# Patient Record
Sex: Male | Born: 1985 | Race: White | Hispanic: No | Marital: Single | Smoking: Current every day smoker
Health system: Southern US, Community
[De-identification: ages and names within clinical notes are randomized; demographics above are authoritative.]

---
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
## 2010-10-23 ENCOUNTER — Emergency Department (HOSPITAL_COMMUNITY)
Admission: EM | Admit: 2010-10-23 | Discharge: 2010-10-23 | Disposition: A | Discharge status: Home / Self Care | Payer: Self-pay | Attending: Emergency Medicine | Admitting: Emergency Medicine

## 2010-10-23 DIAGNOSIS — K047 Periapical abscess without sinus: Secondary | ICD-10-CM | POA: Insufficient documentation

## 2011-11-24 ENCOUNTER — Encounter (HOSPITAL_BASED_OUTPATIENT_CLINIC_OR_DEPARTMENT_OTHER): Payer: Self-pay | Admitting: *Deleted

## 2011-11-24 ENCOUNTER — Emergency Department (HOSPITAL_BASED_OUTPATIENT_CLINIC_OR_DEPARTMENT_OTHER)
Admission: EM | Admit: 2011-11-24 | Discharge: 2011-11-24 | Disposition: A | Discharge status: Home / Self Care | Payer: Self-pay | Attending: Emergency Medicine | Admitting: Emergency Medicine

## 2011-11-24 DIAGNOSIS — S01112A Laceration without foreign body of left eyelid and periocular area, initial encounter: Secondary | ICD-10-CM

## 2011-11-24 DIAGNOSIS — S01119A Laceration without foreign body of unspecified eyelid and periocular area, initial encounter: Secondary | ICD-10-CM | POA: Insufficient documentation

## 2011-11-24 DIAGNOSIS — W108XXA Fall (on) (from) other stairs and steps, initial encounter: Secondary | ICD-10-CM | POA: Insufficient documentation

## 2011-11-24 DIAGNOSIS — Y92009 Unspecified place in unspecified non-institutional (private) residence as the place of occurrence of the external cause: Secondary | ICD-10-CM | POA: Insufficient documentation

## 2011-11-24 MED ORDER — TETANUS-DIPHTH-ACELL PERTUSSIS 5-2.5-18.5 LF-MCG/0.5 IM SUSP
0.5000 mL | Freq: Once | INTRAMUSCULAR | Status: AC
  Administered 2011-11-24: 0.5 mL via INTRAMUSCULAR
  Filled 2011-11-24: qty 0.5

## 2011-11-24 MED ORDER — LIDOCAINE HCL 2 % IJ SOLN
INTRAMUSCULAR | Status: AC
  Filled 2011-11-24: qty 1

## 2011-11-24 NOTE — ED Provider Notes (Signed)
Medical screening examination/treatment/procedure(s) were performed by non-physician practitioner and as supervising physician I was immediately available for consultation/collaboration.   Sofhia Ulibarri A. Sarp Vernier, MD 11/24/11 2312 

## 2011-11-24 NOTE — ED Provider Notes (Signed)
History     CSN: [id number]  Arrival date & time 11/24/11  1232   None     Chief Complaint  Patient presents with  . Laceration    (Consider location/radiation/quality/duration/timing/severity/associated sxs/prior treatment) Patient is a 26 y.o. male presenting with facial injury. The history is provided by the patient. No language interpreter was used.  Facial Injury  The incident occurred yesterday. The injury mechanism was a fall. The context of the injury is unknown. There is an injury to the face. The pain is mild. It is unlikely that a foreign body is present. There were no sick contacts. He has received no recent medical care.  Pt reports he fell down steps last night.  Pt complains of laceration to eyelid.  No loc. Pt reports feels normal and has normal vision  History reviewed. No pertinent past medical history.  History reviewed. No pertinent past surgical history.  History reviewed. No pertinent family history.  History  Substance Use Topics  . Smoking status: Current Everyday Smoker  . Smokeless tobacco: Not on file  . Alcohol Use: Yes      Review of Systems  Skin: Positive for wound.  All other systems reviewed and are negative.    Allergies  Review of patient's allergies indicates no known allergies.  Home Medications  No current outpatient prescriptions on file.  BP 101/70  Pulse 94  Temp(Src) 98.2 F (36.8 C) (Oral)  Resp 20  Ht 6' (1.829 m)  Wt 140 lb (63.504 kg)  BMI 18.99 kg/m2  SpO2 98%  Physical Exam  Constitutional: He is oriented to person, place, and time. He appears well-developed and well-nourished.  HENT:  Head: Normocephalic.       Abrasion forehead,  1.5 cm laceration hour glass shaped corner of left eyelid and eyelid  Eyes: Conjunctivae and EOM are normal. Pupils are equal, round, and reactive to light.  Neck: Normal range of motion. Neck supple.  Musculoskeletal: Normal range of motion.  Neurological: He is alert and  oriented to person, place, and time. He has normal reflexes.  Skin: Skin is warm.  Psychiatric: He has a normal mood and affect.    ED Course  LACERATION REPAIR Date/Time: 11/24/2011 4:03 PM Performed by: Elson Areas Authorized by: Elson Areas Consent: Verbal consent obtained. Risks and benefits: risks, benefits and alternatives were discussed Consent given by: patient Required items: required blood products, implants, devices, and special equipment available Patient identity confirmed: verbally with patient Time out: Immediately prior to procedure a "time out" was called to verify the correct patient, procedure, equipment, support staff and site/side marked as required. Laceration length: 1.5 cm Foreign bodies: no foreign bodies Tendon involvement: none Nerve involvement: none Preparation: Patient was prepped and draped in the usual sterile fashion. Irrigation solution: saline Amount of cleaning: standard Debridement: none Degree of undermining: none Wound fascia closure material used: 7.0 vicryl. Number of sutures: 5 Technique: simple Approximation: close Approximation difficulty: simple Patient tolerance: Patient tolerated the procedure well with no immediate complications.   (including critical care time)  Labs Reviewed - No data to display No results found.   No diagnosis found.    MDM  Pt counseled on dissolvable sutures.  I advised return if any problems.        Lonia Skinner [place], [place] 11/24/11 1606

## 2011-11-24 NOTE — ED Notes (Signed)
Marcus Ortega, EDPA at bedside. 
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
## 2011-11-24 NOTE — Discharge Instructions (Signed)

## 2014-01-11 ENCOUNTER — Emergency Department (HOSPITAL_COMMUNITY): Payer: Self-pay

## 2014-01-11 ENCOUNTER — Encounter (HOSPITAL_COMMUNITY): Payer: Self-pay | Admitting: Emergency Medicine

## 2014-01-11 ENCOUNTER — Inpatient Hospital Stay (HOSPITAL_COMMUNITY)
Admission: EM | Admit: 2014-01-11 | Discharge: 2014-01-14 | DRG: 085 | Disposition: A | Discharge status: Home / Self Care | Payer: Self-pay | Attending: General Surgery | Admitting: General Surgery

## 2014-01-11 ENCOUNTER — Inpatient Hospital Stay (HOSPITAL_COMMUNITY): Payer: Self-pay

## 2014-01-11 DIAGNOSIS — S02109A Fracture of base of skull, unspecified side, initial encounter for closed fracture: Principal | ICD-10-CM | POA: Diagnosis present

## 2014-01-11 DIAGNOSIS — I609 Nontraumatic subarachnoid hemorrhage, unspecified: Secondary | ICD-10-CM | POA: Diagnosis present

## 2014-01-11 DIAGNOSIS — R51 Headache: Secondary | ICD-10-CM | POA: Diagnosis present

## 2014-01-11 DIAGNOSIS — S06309A Unspecified focal traumatic brain injury with loss of consciousness of unspecified duration, initial encounter: Principal | ICD-10-CM

## 2014-01-11 DIAGNOSIS — J9600 Acute respiratory failure, unspecified whether with hypoxia or hypercapnia: Secondary | ICD-10-CM

## 2014-01-11 DIAGNOSIS — S0630AA Unspecified focal traumatic brain injury with loss of consciousness status unknown, initial encounter: Principal | ICD-10-CM | POA: Diagnosis present

## 2014-01-11 DIAGNOSIS — S069XAA Unspecified intracranial injury with loss of consciousness status unknown, initial encounter: Secondary | ICD-10-CM | POA: Diagnosis present

## 2014-01-11 DIAGNOSIS — F17200 Nicotine dependence, unspecified, uncomplicated: Secondary | ICD-10-CM | POA: Diagnosis present

## 2014-01-11 DIAGNOSIS — S069X9A Unspecified intracranial injury with loss of consciousness of unspecified duration, initial encounter: Secondary | ICD-10-CM

## 2014-01-11 DIAGNOSIS — S065X9A Traumatic subdural hemorrhage with loss of consciousness of unspecified duration, initial encounter: Secondary | ICD-10-CM | POA: Diagnosis present

## 2014-01-11 DIAGNOSIS — IMO0002 Reserved for concepts with insufficient information to code with codable children: Secondary | ICD-10-CM | POA: Diagnosis present

## 2014-01-11 DIAGNOSIS — R4020 Unspecified coma: Secondary | ICD-10-CM | POA: Diagnosis present

## 2014-01-11 DIAGNOSIS — D62 Acute posthemorrhagic anemia: Secondary | ICD-10-CM | POA: Diagnosis present

## 2014-01-11 DIAGNOSIS — G936 Cerebral edema: Secondary | ICD-10-CM | POA: Diagnosis present

## 2014-01-11 DIAGNOSIS — F10129 Alcohol abuse with intoxication, unspecified: Secondary | ICD-10-CM | POA: Diagnosis present

## 2014-01-11 DIAGNOSIS — S065XAA Traumatic subdural hemorrhage with loss of consciousness status unknown, initial encounter: Secondary | ICD-10-CM | POA: Diagnosis present

## 2014-01-11 DIAGNOSIS — R4182 Altered mental status, unspecified: Secondary | ICD-10-CM | POA: Diagnosis present

## 2014-01-11 DIAGNOSIS — F10929 Alcohol use, unspecified with intoxication, unspecified: Secondary | ICD-10-CM

## 2014-01-11 DIAGNOSIS — F1010 Alcohol abuse, uncomplicated: Secondary | ICD-10-CM | POA: Diagnosis present

## 2014-01-11 DIAGNOSIS — R519 Headache, unspecified: Secondary | ICD-10-CM | POA: Diagnosis present

## 2014-01-11 LAB — CBC WITH DIFFERENTIAL/PLATELET
BASOS ABS: 0 10*3/uL (ref 0.0–0.1)
Basophils Relative: 0 % (ref 0–1)
EOS ABS: 0 10*3/uL (ref 0.0–0.7)
Eosinophils Relative: 0 % (ref 0–5)
HCT: 44.2 % (ref 39.0–52.0)
HEMOGLOBIN: 15.6 g/dL (ref 13.0–17.0)
Lymphocytes Relative: 9 % — ABNORMAL LOW (ref 12–46)
Lymphs Abs: 1.7 10*3/uL (ref 0.7–4.0)
MCH: 31 pg (ref 26.0–34.0)
MCHC: 35.3 g/dL (ref 30.0–36.0)
MCV: 87.9 fL (ref 78.0–100.0)
MONOS PCT: 4 % (ref 3–12)
Monocytes Absolute: 0.8 10*3/uL (ref 0.1–1.0)
Neutro Abs: 16.4 10*3/uL — ABNORMAL HIGH (ref 1.7–7.7)
Neutrophils Relative %: 87 % — ABNORMAL HIGH (ref 43–77)
Platelets: 293 10*3/uL (ref 150–400)
RBC: 5.03 MIL/uL (ref 4.22–5.81)
RDW: 13.8 % (ref 11.5–15.5)
WBC: 18.9 10*3/uL — ABNORMAL HIGH (ref 4.0–10.5)

## 2014-01-11 LAB — BASIC METABOLIC PANEL
BUN: 11 mg/dL (ref 6–23)
CALCIUM: 8.4 mg/dL (ref 8.4–10.5)
CO2: 25 mEq/L (ref 19–32)
Chloride: 102 mEq/L (ref 96–112)
Creatinine, Ser: 0.95 mg/dL (ref 0.50–1.35)
GFR calc non Af Amer: >90 mL/min (ref >90)
GLUCOSE: 111 mg/dL — AB (ref 70–99)
Potassium: 3.6 mEq/L — ABNORMAL LOW (ref 3.7–5.3)
Sodium: 143 mEq/L (ref 137–147)

## 2014-01-11 LAB — CBG MONITORING, ED: GLUCOSE-CAPILLARY: 121 mg/dL — AB (ref 70–99)

## 2014-01-11 LAB — URINALYSIS, ROUTINE W REFLEX MICROSCOPIC
Bilirubin Urine: NEGATIVE
GLUCOSE, UA: 100 mg/dL — AB
HGB URINE DIPSTICK: NEGATIVE
KETONES UR: NEGATIVE mg/dL
Leukocytes, UA: NEGATIVE
Nitrite: NEGATIVE
PROTEIN: NEGATIVE mg/dL
Specific Gravity, Urine: 1.017 (ref 1.005–1.030)
UROBILINOGEN UA: 0.2 mg/dL (ref 0.0–1.0)
pH: 5.5 (ref 5.0–8.0)

## 2014-01-11 LAB — I-STAT ARTERIAL BLOOD GAS, ED
Acid-base deficit: 5 mmol/L — ABNORMAL HIGH (ref 0.0–2.0)
Bicarbonate: 22.2 meq/L (ref 20.0–24.0)
O2 Saturation: 100 %
Patient temperature: 98.6
TCO2: 24 mmol/L (ref 0–100)
pCO2 arterial: 49 mmHg — ABNORMAL HIGH (ref 35.0–45.0)
pH, Arterial: 7.264 — ABNORMAL LOW (ref 7.350–7.450)
pO2, Arterial: 491 mmHg — ABNORMAL HIGH (ref 80.0–100.0)

## 2014-01-11 LAB — APTT: aPTT: 30 seconds (ref 24–37)

## 2014-01-11 LAB — RAPID URINE DRUG SCREEN, HOSP PERFORMED
AMPHETAMINES: NOT DETECTED
BARBITURATES: NOT DETECTED
Benzodiazepines: NOT DETECTED
Cocaine: NOT DETECTED
Opiates: NOT DETECTED
Tetrahydrocannabinol: NOT DETECTED

## 2014-01-11 LAB — PROTIME-INR
INR: 1.03 (ref 0.00–1.49)
PROTHROMBIN TIME: 13.3 s (ref 11.6–15.2)

## 2014-01-11 LAB — ETHANOL: Alcohol, Ethyl (B): 376 mg/dL — ABNORMAL HIGH (ref 0–11)

## 2014-01-11 LAB — MRSA PCR SCREENING: MRSA by PCR: NEGATIVE

## 2014-01-11 LAB — LACTIC ACID, PLASMA: Lactic Acid, Venous: 2.1 mmol/L (ref 0.5–2.2)

## 2014-01-11 LAB — TRIGLYCERIDES: Triglycerides: 217 mg/dL — ABNORMAL HIGH (ref <150)

## 2014-01-11 MED ORDER — MIDAZOLAM HCL 2 MG/2ML IJ SOLN
INTRAMUSCULAR | Status: AC
  Filled 2014-01-11: qty 6

## 2014-01-11 MED ORDER — POTASSIUM CHLORIDE IN NACL 20-0.9 MEQ/L-% IV SOLN
INTRAVENOUS | Status: DC
  Administered 2014-01-11 – 2014-01-13 (×4): via INTRAVENOUS
  Filled 2014-01-11 (×6): qty 1000

## 2014-01-11 MED ORDER — KETOROLAC TROMETHAMINE 30 MG/ML IJ SOLN: 30.0000 mg | Freq: Four times a day (QID) | INTRAMUSCULAR | Status: DC | PRN

## 2014-01-11 MED ORDER — SODIUM CHLORIDE 0.9 % IV SOLN
1000.0000 mg | Freq: Once | INTRAVENOUS | Status: AC
  Administered 2014-01-11: 1000 mg via INTRAVENOUS
  Filled 2014-01-11: qty 10

## 2014-01-11 MED ORDER — PROPOFOL 10 MG/ML IV EMUL
5.0000 ug/kg/min | Freq: Once | INTRAVENOUS | Status: AC
  Administered 2014-01-11: 70 ug/kg/min via INTRAVENOUS
  Filled 2014-01-11: qty 100

## 2014-01-11 MED ORDER — PANTOPRAZOLE SODIUM 40 MG PO TBEC
40.0000 mg | DELAYED_RELEASE_TABLET | Freq: Every day | ORAL | Status: DC
  Administered 2014-01-13 – 2014-01-14 (×2): 40 mg via ORAL
  Filled 2014-01-11 (×2): qty 1

## 2014-01-11 MED ORDER — PROPOFOL 10 MG/ML IV EMUL: 0.0000 ug/kg/min | INTRAVENOUS | Status: DC

## 2014-01-11 MED ORDER — MIDAZOLAM HCL 2 MG/2ML IJ SOLN
INTRAMUSCULAR | Status: AC
  Filled 2014-01-11: qty 2

## 2014-01-11 MED ORDER — SODIUM CHLORIDE 0.9 % IV BOLUS (SEPSIS)
1000.0000 mL | Freq: Once | INTRAVENOUS | Status: AC
  Administered 2014-01-11: 1000 mL via INTRAVENOUS

## 2014-01-11 MED ORDER — IOHEXOL 300 MG/ML  SOLN
80.0000 mL | Freq: Once | INTRAMUSCULAR | Status: AC | PRN
  Administered 2014-01-11: 80 mL via INTRAVENOUS

## 2014-01-11 MED ORDER — CHLORHEXIDINE GLUCONATE 0.12 % MT SOLN
15.0000 mL | Freq: Two times a day (BID) | OROMUCOSAL | Status: DC
  Administered 2014-01-12: 15 mL via OROMUCOSAL
  Filled 2014-01-11 (×2): qty 15

## 2014-01-11 MED ORDER — VECURONIUM BROMIDE 10 MG IV SOLR: 10.0000 mg | Freq: Once | INTRAVENOUS | Status: DC

## 2014-01-11 MED ORDER — PROPOFOL 10 MG/ML IV EMUL
INTRAVENOUS | Status: AC
  Filled 2014-01-11: qty 100

## 2014-01-11 MED ORDER — PANTOPRAZOLE SODIUM 40 MG IV SOLR
40.0000 mg | Freq: Every day | INTRAVENOUS | Status: DC
Start: 2014-01-11 — End: 2014-01-13
  Administered 2014-01-11 – 2014-01-12 (×2): 40 mg via INTRAVENOUS
  Filled 2014-01-11 (×3): qty 40

## 2014-01-11 MED ORDER — BIOTENE DRY MOUTH MT LIQD
15.0000 mL | Freq: Four times a day (QID) | OROMUCOSAL | Status: DC
  Administered 2014-01-11 – 2014-01-12 (×5): 15 mL via OROMUCOSAL

## 2014-01-11 MED ORDER — PROPOFOL 10 MG/ML IV EMUL
5.0000 ug/kg/min | Freq: Once | INTRAVENOUS | Status: DC
  Administered 2014-01-11: 50 mg via INTRAVENOUS
  Administered 2014-01-11: 10 ug/kg/min via INTRAVENOUS

## 2014-01-11 MED ORDER — ONDANSETRON HCL 4 MG/2ML IJ SOLN
4.0000 mg | Freq: Four times a day (QID) | INTRAMUSCULAR | Status: DC | PRN
  Administered 2014-01-12 – 2014-01-13 (×3): 4 mg via INTRAVENOUS
  Filled 2014-01-11 (×4): qty 2

## 2014-01-11 MED ORDER — ROCURONIUM BROMIDE 50 MG/5ML IV SOLN
INTRAVENOUS | Status: AC
  Filled 2014-01-11: qty 2

## 2014-01-11 MED ORDER — SODIUM CHLORIDE 0.9 % IV SOLN
Freq: Once | INTRAVENOUS | Status: AC
  Administered 2014-01-11: 06:00:00 via INTRAVENOUS

## 2014-01-11 MED ORDER — PROPOFOL 10 MG/ML IV EMUL
INTRAVENOUS | Status: AC
  Administered 2014-01-11: 50 mg via INTRAVENOUS
  Filled 2014-01-11: qty 100

## 2014-01-11 MED ORDER — PROPOFOL 10 MG/ML IV EMUL
5.0000 ug/kg/min | INTRAVENOUS | Status: DC
  Administered 2014-01-11 (×2): 70 ug/kg/min via INTRAVENOUS
  Administered 2014-01-11: 60 ug/kg/min via INTRAVENOUS
  Administered 2014-01-12 (×2): 70 ug/kg/min via INTRAVENOUS
  Filled 2014-01-11 (×4): qty 100

## 2014-01-11 MED ORDER — MANNITOL 25 % IV SOLN
25.0000 g | Freq: Once | INTRAVENOUS | Status: DC
  Filled 2014-01-11: qty 100

## 2014-01-11 MED ORDER — ONDANSETRON HCL 4 MG/2ML IJ SOLN
4.0000 mg | Freq: Once | INTRAMUSCULAR | Status: AC
  Administered 2014-01-11: 4 mg via INTRAVENOUS
  Filled 2014-01-11: qty 2

## 2014-01-11 MED ORDER — MIDAZOLAM HCL 2 MG/2ML IJ SOLN
4.0000 mg | Freq: Once | INTRAMUSCULAR | Status: AC
  Administered 2014-01-11: 4 mg via INTRAVENOUS

## 2014-01-11 MED ORDER — ROCURONIUM BROMIDE 50 MG/5ML IV SOLN
1.0000 mg/kg | Freq: Once | INTRAVENOUS | Status: AC
  Administered 2014-01-11: 10 mg via INTRAVENOUS

## 2014-01-11 MED ORDER — ETOMIDATE 2 MG/ML IV SOLN
INTRAVENOUS | Status: AC
  Administered 2014-01-11: 20 mg
  Filled 2014-01-11: qty 20

## 2014-01-11 MED ORDER — SUCCINYLCHOLINE CHLORIDE 20 MG/ML IJ SOLN
100.0000 mg | Freq: Once | INTRAMUSCULAR | Status: AC
  Administered 2014-01-11: 100 mg via INTRAVENOUS

## 2014-01-11 MED ORDER — LIDOCAINE HCL (CARDIAC) 20 MG/ML IV SOLN
INTRAVENOUS | Status: AC
  Administered 2014-01-11: 100 mg
  Filled 2014-01-11: qty 5

## 2014-01-11 MED ORDER — SUCCINYLCHOLINE CHLORIDE 20 MG/ML IJ SOLN
INTRAMUSCULAR | Status: AC
  Administered 2014-01-11: 100 mg
  Filled 2014-01-11: qty 1

## 2014-01-11 MED ORDER — PROPOFOL 10 MG/ML IV BOLUS
INTRAVENOUS | Status: AC
  Filled 2014-01-11: qty 20

## 2014-01-11 MED ORDER — FENTANYL CITRATE 0.05 MG/ML IJ SOLN
INTRAMUSCULAR | Status: AC
  Administered 2014-01-11: 100 ug via INTRAVENOUS
  Filled 2014-01-11: qty 2

## 2014-01-11 MED ORDER — FENTANYL CITRATE 0.05 MG/ML IJ SOLN
100.0000 ug | Freq: Once | INTRAMUSCULAR | Status: AC
  Administered 2014-01-11: 100 ug via INTRAVENOUS

## 2014-01-11 MED ORDER — ONDANSETRON HCL 4 MG PO TABS: 4.0000 mg | ORAL_TABLET | Freq: Four times a day (QID) | ORAL | Status: DC | PRN

## 2014-01-11 MED ORDER — FENTANYL CITRATE 0.05 MG/ML IJ SOLN
100.0000 ug | INTRAMUSCULAR | Status: DC | PRN
  Administered 2014-01-11 – 2014-01-12 (×4): 100 ug via INTRAVENOUS
  Filled 2014-01-11 (×5): qty 2

## 2014-01-11 MED ORDER — MIDAZOLAM HCL 2 MG/2ML IJ SOLN
6.0000 mg | INTRAMUSCULAR | Status: DC | PRN
  Administered 2014-01-11 – 2014-01-12 (×6): 6 mg via INTRAVENOUS
  Filled 2014-01-11 (×4): qty 6

## 2014-01-11 MED ORDER — THIAMINE HCL 100 MG/ML IJ SOLN
100.0000 mg | Freq: Once | INTRAMUSCULAR | Status: AC
  Administered 2014-01-11: 100 mg via INTRAVENOUS
  Filled 2014-01-11: qty 2

## 2014-01-11 MED ORDER — PROPOFOL 10 MG/ML IV EMUL
5.0000 ug/kg/min | INTRAVENOUS | Status: DC
  Administered 2014-01-11: 70 ug/kg/min via INTRAVENOUS
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
## 2014-01-11 NOTE — ED Notes (Signed)
NeuroSurgery at bedside.

## 2014-01-11 NOTE — Progress Notes (Signed)
Responded to referral from on call night chaplain to continue support to patient and family. Mother asked that  I would pray with her son. Patient going to ICU. Will pass on to unit Chaplain for on going support.  Provided prayer, spiritual and emotional support.  Will follow as needed.  01/11/14 1000  Clinical Encounter Type  Visited With Patient and family together;Health care provider  Visit Type Spiritual support;ED;Trauma  Referral From Nurse  Spiritual Encounters  Spiritual Needs Prayer;Emotional  Stress Factors  Family Stress Factors Exhausted  Mathis Daday L Londa Mackowski, Lunette StandsChaplain, pager [phone]

## 2014-01-11 NOTE — ED Notes (Signed)
Patient continues to be restless and going for tubes.  Propofol increased.  Dr Lavella LemonsManly notified.  New orders for midazolam per Dr Lavella LemonsManly.
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
## 2014-01-11 NOTE — ED Notes (Signed)
Family at bedside. 

## 2014-01-11 NOTE — Progress Notes (Addendum)
Pts ETT advanced from 24 at the lip to 25 at the lip after Xray per Dr. Lavella LemonsManly. BBS clear

## 2014-01-11 NOTE — ED Notes (Signed)
Patient transported to CT 

## 2014-01-11 NOTE — ED Notes (Signed)
Checked CBG 121
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
## 2014-01-11 NOTE — ED Notes (Signed)
Patient combative and restless.  Propofol drip increased at this time.

## 2014-01-11 NOTE — ED Notes (Signed)
Soft wrist restraints started at this time per Dr Lavella LemonsManly.

## 2014-01-11 NOTE — ED Notes (Signed)
Patient continues to be restless and trying to pull all tubes (ETT, IV, Urinary catheter).  Propofol increased.
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
## 2014-01-11 NOTE — ED Notes (Signed)
Patient returned from CT

## 2014-01-11 NOTE — H&P (Signed)
I have seen and examined the patient and agree with the assessment and plans.  Britne Borelli A. Dequavious Harshberger  MD, FACS  
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
## 2014-01-11 NOTE — ED Notes (Signed)
Patient to CT.
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
## 2014-01-12 ENCOUNTER — Inpatient Hospital Stay (HOSPITAL_COMMUNITY): Payer: Self-pay

## 2014-01-12 DIAGNOSIS — Z9911 Dependence on respirator [ventilator] status: Secondary | ICD-10-CM

## 2014-01-12 LAB — BASIC METABOLIC PANEL
BUN: 8 mg/dL (ref 6–23)
CALCIUM: 8.2 mg/dL — AB (ref 8.4–10.5)
CO2: 24 meq/L (ref 19–32)
CREATININE: 0.86 mg/dL (ref 0.50–1.35)
Chloride: 98 mEq/L (ref 96–112)
GFR calc Af Amer: >90 mL/min (ref >90)
GFR calc non Af Amer: >90 mL/min (ref >90)
GLUCOSE: 102 mg/dL — AB (ref 70–99)
Potassium: 3.3 mEq/L — ABNORMAL LOW (ref 3.7–5.3)
Sodium: 136 mEq/L — ABNORMAL LOW (ref 137–147)

## 2014-01-12 LAB — CBC
HCT: 36.1 % — ABNORMAL LOW (ref 39.0–52.0)
HEMOGLOBIN: 12.5 g/dL — AB (ref 13.0–17.0)
MCH: 30.2 pg (ref 26.0–34.0)
MCHC: 34.6 g/dL (ref 30.0–36.0)
MCV: 87.2 fL (ref 78.0–100.0)
Platelets: 231 10*3/uL (ref 150–400)
RBC: 4.14 MIL/uL — ABNORMAL LOW (ref 4.22–5.81)
RDW: 13.9 % (ref 11.5–15.5)
WBC: 14.4 10*3/uL — ABNORMAL HIGH (ref 4.0–10.5)

## 2014-01-12 MED ORDER — PNEUMOCOCCAL VAC POLYVALENT 25 MCG/0.5ML IJ INJ
0.5000 mL | INJECTION | INTRAMUSCULAR | Status: DC
  Filled 2014-01-12: qty 0.5

## 2014-01-12 MED ORDER — HYDROCODONE-ACETAMINOPHEN 10-325 MG PO TABS
0.5000 | ORAL_TABLET | ORAL | Status: DC | PRN
  Administered 2014-01-12 – 2014-01-13 (×6): 1 via ORAL
  Administered 2014-01-14: 2 via ORAL
  Administered 2014-01-14: 1 via ORAL
  Administered 2014-01-14 (×2): 2 via ORAL
  Filled 2014-01-12: qty 2
  Filled 2014-01-12: qty 1
  Filled 2014-01-12 (×3): qty 2
  Filled 2014-01-12 (×2): qty 1
  Filled 2014-01-12: qty 2
  Filled 2014-01-12 (×4): qty 1

## 2014-01-12 MED ORDER — MORPHINE SULFATE 2 MG/ML IJ SOLN
2.0000 mg | INTRAMUSCULAR | Status: DC | PRN
  Administered 2014-01-12 – 2014-01-13 (×3): 2 mg via INTRAVENOUS
  Filled 2014-01-12 (×3): qty 1

## 2014-01-12 MED ORDER — POLYETHYLENE GLYCOL 3350 17 G PO PACK
17.0000 g | PACK | Freq: Every day | ORAL | Status: DC
  Administered 2014-01-13 – 2014-01-14 (×2): 17 g via ORAL
  Filled 2014-01-12 (×3): qty 1

## 2014-01-12 MED ORDER — DOCUSATE SODIUM 100 MG PO CAPS
100.0000 mg | ORAL_CAPSULE | Freq: Two times a day (BID) | ORAL | Status: DC
  Administered 2014-01-12 – 2014-01-14 (×4): 100 mg via ORAL
  Filled 2014-01-12 (×6): qty 1

## 2014-01-12 NOTE — Progress Notes (Addendum)
c-collar d/c per order.   Marcus Ortega
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
## 2014-01-12 NOTE — Procedures (Signed)
Extubation Procedure Note  Patient Details:   Name: Marcus BurkeJoseph B Xxxdunlap DOB: 1985-12-27 MRN: [id number]   Airway Documentation:     Evaluation  O2 sats: stable throughout Complications: No apparent complications Patient did tolerate procedure well. Bilateral Breath Sounds: Clear Suctioning: Airway Yes  Patient was extubated to nasal cannula per MD order. Breath sounds are clear and no stridor present. Patient tolerated procedure well and is speaking well. No complications noted. Rt will continue to monitor.   Idell PicklesDana R Abigal Choung 01/12/2014, 8:50 AM
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
## 2014-01-13 MED ORDER — HYDROMORPHONE HCL PF 1 MG/ML IJ SOLN: 0.5000 mg | INTRAMUSCULAR | Status: DC | PRN

## 2014-01-13 NOTE — Progress Notes (Signed)
I await therapy evals to determine rehab needs. Pending. [phone]
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
## 2014-01-13 NOTE — Progress Notes (Signed)
Oupt rehab most appropriate for this pt per therapy recommendations. [phone]
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
## 2014-01-13 NOTE — Progress Notes (Signed)
UR completed.  Oria Klimas, RN BSN MHA CCM Trauma/Neuro ICU Case Manager [phone]  
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
## 2014-01-13 NOTE — Progress Notes (Signed)
Patient arrived to room transferred from ICU. He denied any pain except a little headache. Safety precautions and orders reviewed with patient. Call light within reach. Bed alarm activated. Will continue to monitor.  Sim BoastHavy , RN

## 2014-01-14 DIAGNOSIS — D62 Acute posthemorrhagic anemia: Secondary | ICD-10-CM | POA: Diagnosis present

## 2014-01-14 DIAGNOSIS — R519 Headache, unspecified: Secondary | ICD-10-CM | POA: Diagnosis present

## 2014-01-14 DIAGNOSIS — J9600 Acute respiratory failure, unspecified whether with hypoxia or hypercapnia: Secondary | ICD-10-CM | POA: Diagnosis present

## 2014-01-14 DIAGNOSIS — F10129 Alcohol abuse with intoxication, unspecified: Secondary | ICD-10-CM | POA: Diagnosis present

## 2014-01-14 DIAGNOSIS — R51 Headache: Secondary | ICD-10-CM | POA: Diagnosis present

## 2014-01-14 MED ORDER — POLYETHYLENE GLYCOL 3350 17 G PO PACK: 17.0000 g | PACK | Freq: Every day | ORAL | Status: DC

## 2014-01-14 MED ORDER — HYDROCODONE-ACETAMINOPHEN 10-325 MG PO TABS: 0.5000 | ORAL_TABLET | Freq: Four times a day (QID) | ORAL | Status: DC | PRN

## 2014-01-14 MED ORDER — DSS 100 MG PO CAPS: 100.0000 mg | ORAL_CAPSULE | Freq: Two times a day (BID) | ORAL | Status: DC

## 2014-01-14 NOTE — Discharge Instructions (Signed)
Alcohol Intoxication Alcohol intoxication occurs when you drink enough alcohol that it affects your ability to function. It can be mild or very severe. Drinking a lot of alcohol in a short time is called binge drinking. This can be very harmful. Drinking alcohol can also be more dangerous if you are taking medicines or other drugs. Some of the effects caused by alcohol may include:  Loss of coordination.  Changes in mood and behavior.  Unclear thinking.  Trouble talking (slurred speech).  Throwing up (vomiting).  Confusion.  Slowed breathing.  Twitching and shaking (seizures).  Loss of consciousness. HOME CARE  Do not drive after drinking alcohol.  Drink enough water and fluids to keep your pee (urine) clear or pale yellow. Avoid caffeine.  Only take medicine as told by your doctor. GET HELP IF:  You throw up (vomit) many times.  You do not feel better after a few days.  You frequently have alcohol intoxication. Your doctor can help decide if you should see a substance use treatment counselor. GET HELP RIGHT AWAY IF:  You become shaky when you stop drinking.  You have twitching and shaking.  You throw up blood. It may look bright red or like coffee grounds.  You notice blood in your poop (bowel movements).  You become lightheaded or pass out (faint). MAKE SURE YOU:   Understand these instructions.  Will watch your condition.  Will get help right away if you are not doing well or get worse. Document Released: 02/13/2008 Document Revised: 04/29/2013 Document Reviewed: 01/30/2013 [facility] Patient Information 2014 [employer], [place].  Traumatic Brain Injury Traumatic brain injury (TBI) occurs when an injury to the head causes the brain to move back and forth. The risk of brain injury varies with the severity of the trauma. The damage can be confined to one area of the brain (focal) or involve different areas of the brain (diffuse). The severity of a brain injury can  range from:  A blow or jolt to the head that disrupts the normal function of the brain (concussion).  A deep state of unconsciousness (coma).  Death. CAUSES  A brain injury can result from:  A closed head injury. This occurs when the head suddenly and violently hits an object but the object does not break through the skull. Examples include:  A direct blow (hitting your head on a hard surface).  An indirect blow (when your head moves rapidly and violently back and forth, like in a car crash). This injury is called contrecoup (involving a blow and counter blow). Shaken baby syndrome is a severe type of this injury. It happens when a baby is shaken forcibly enough to cause extreme contrecoup injury.  Penetrating head injury. A penetrating head injury occurs when an object pierces the skull and enters the brain tissue. Examples include:  A skull fracture occurs when the skull cracks or breaks.  A depressed skull fracture occurs when pieces of the broken skull press into the tissue of the brain. This can cause bruising of the brain tissue called a contusion. In both closed and penetrating head injuries, damage to blood vessels can cause heavy bleeding into or around the brain.  SYMPTOMS  The symptoms of a TBI depend on the type and severity of the injury. The most common symptoms include:   Confusion (disorientation) or other thinking problems.  An inability to remember events around the time of the injury (amnesia).  Difficulty staying awake or passing out (loss of consciousness).  Difficulty maintaining  your balance or feeling unsteady.  Slow reaction time.  Difficulty learning or remembering things you have heard.  Headache.  Blurry vision.  Vomiting.  Seizures.  Swelling of the scalp. This occurs because of bleeding or swelling under the skin of the skull when the head is hit. TREATMENT Treatment of traumatic brain injury can involve a range of different medical  options:  If a brain injury is moderate to severe, a hospital stay will be necessary to monitor:  Neurological status.  Pressure or swelling of the brain (intracranial pressure).  For seizures.  Severe brain injury cases may need surgery to:  Control bleeding.  Relieve pressure on the brain.  Remove objects from the brain that result from a penetrating injury.  Repair the skull from an injury.  Long-term treatment of a brain injury can involve rehabilitation work such as:  Physical therapy.  Occupational therapy.  Speech therapy. PROGNOSIS The outcome of TBI depends on the cause of the injury, location, severity, and extent of neurological damage. Outcomes range from good recovery to death. Long term consequences of a TBI can include:  Difficulty concentrating or having a short attention span.  Change in personality.  Irritability.  Headaches.  Blurry vision.  Sleepiness.  Depression.  Unsteadiness that makes walking or standing hard to do. For more information and support, contact: [employer] [facility] [employer].  Document Released: 08/17/2002 Document Revised: 06/17/2013 Document Reviewed: 08/25/2009 [facility] Patient Information 2014 [place]Care, [place]LL[place].

## 2014-01-14 NOTE — Progress Notes (Signed)
Patient examined and I agree with the assessment and plan  Violeta GelinasBurke Jerriah Ines, MD, MPH, FACS Trauma: [phone] General Surgery: [phone]  01/14/2014 1:16 PM
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
## 2014-01-14 NOTE — Progress Notes (Signed)
Pt A&O x4; pt discharge education and instructions completed with pt and mother at bedside. Both voices understanding and denies any questions. All lines including IV removed from pt. Pt given handout information about alcohol and concussion. Pt transported off unit via wheelchair whiles mother left ahead with pt belongings to car. Pt given his prescription for Hydrocodone 10-325mg . P. Amo Kionte Baumgardner RN.

## 2014-01-14 NOTE — Discharge Summary (Signed)
Marcus Townley, MD, MPH, FACS Trauma: [phone] General Surgery: [phone]  

## 2014-01-14 NOTE — Progress Notes (Signed)
Pt with need for HHPT at d/c. Confirmed address and phone number for him and for his mother also listed in EPIC. Chose [facility] based on coverage restrictions. Denies need for any DME at d/c.

## 2014-07-17 ENCOUNTER — Emergency Department (HOSPITAL_BASED_OUTPATIENT_CLINIC_OR_DEPARTMENT_OTHER)
Admission: EM | Admit: 2014-07-17 | Discharge: 2014-07-17 | Disposition: A | Discharge status: Home / Self Care | Payer: Self-pay | Attending: Emergency Medicine | Admitting: Emergency Medicine

## 2014-07-17 ENCOUNTER — Encounter (HOSPITAL_BASED_OUTPATIENT_CLINIC_OR_DEPARTMENT_OTHER): Payer: Self-pay | Admitting: *Deleted

## 2014-07-17 DIAGNOSIS — Z720 Tobacco use: Secondary | ICD-10-CM | POA: Insufficient documentation

## 2014-07-17 DIAGNOSIS — Z79899 Other long term (current) drug therapy: Secondary | ICD-10-CM | POA: Insufficient documentation

## 2014-07-17 DIAGNOSIS — K047 Periapical abscess without sinus: Secondary | ICD-10-CM | POA: Insufficient documentation

## 2014-07-17 DIAGNOSIS — R590 Localized enlarged lymph nodes: Secondary | ICD-10-CM | POA: Insufficient documentation

## 2014-07-17 MED ORDER — HYDROCODONE-ACETAMINOPHEN 5-325 MG PO TABS: 1.0000 | ORAL_TABLET | ORAL | Status: DC | PRN

## 2014-07-17 MED ORDER — CLINDAMYCIN HCL 150 MG PO CAPS: 150.0000 mg | ORAL_CAPSULE | Freq: Four times a day (QID) | ORAL | Status: DC

## 2014-07-17 NOTE — ED Provider Notes (Signed)
CSN: [id number]     Arrival date & time 07/17/14  1015 History   First MD Initiated Contact with Patient 07/17/14 1209     Chief Complaint  Patient presents with  . Facial Pain     (Consider location/radiation/quality/duration/timing/severity/associated sxs/prior Treatment) Patient is a 28 y.o. male presenting with tooth pain. The history is provided by the patient.  Dental Pain Location:  Lower Lower teeth location:  32/RL 3rd molar Quality:  Throbbing Severity:  Severe Onset quality:  Gradual Duration:  1 day Timing:  Constant Progression:  Worsening Chronicity:  New Context: abscess   Relieved by:  Nothing Worsened by:  Nothing tried Ineffective treatments:  NSAIDs Associated symptoms: gum swelling   Risk factors: smoking    Marcus Ortega is a 28 y.o. male who presents to the ED with right jaw pain that started last night and has increased today. The pain is severe.   Although not listed as an allergy the patient states he can not take penicillin or Amoxicillin   History reviewed. No pertinent past medical history. History reviewed. No pertinent past surgical history. No family history on file. History  Substance Use Topics  . Smoking status: Current Every Day Smoker  . Smokeless tobacco: Not on file  . Alcohol Use: Yes    Review of Systems Negative except as stated in HPI   Allergies  Review of patient's allergies indicates no known allergies.  Home Medications   Prior to Admission medications   Medication Sig Start Date End Date Taking? Authorizing Provider  docusate sodium 100 MG CAPS Take 100 mg by mouth 2 (two) times daily. 01/14/14   Megan N Dort, PA-C  HYDROcodone-acetaminophen (NORCO) 10-325 MG per tablet Take 0.5-2 tablets by mouth every 6 (six) hours as needed (1/2 tablet for mild pain, 1 tablet for moderate pain, 2 tablets for severe pain). 01/14/14   Megan N Dort, PA-C  polyethylene glycol (MIRALAX / GLYCOLAX) packet Take 17 g by mouth daily. 01/14/14    Megan N Dort, PA-C   BP 138/89 mmHg  Pulse 57  Temp(Src) 98.2 F (36.8 C) (Oral)  Resp 20  Ht 6' (1.829 m)  Wt 145 lb (65.772 kg)  BMI 19.66 kg/m2  SpO2 100% Physical Exam  Constitutional: He is oriented to person, place, and time. He appears well-developed and well-nourished.  HENT:  Mouth/Throat: Uvula is midline, oropharynx is clear and moist and mucous membranes are normal.    Right lower third molar partially erupted with decay and swelling of the gum surrounding the tooth. Facial swelling noted in the same dental area.   Eyes: EOM are normal.  Neck: Normal range of motion. Neck supple.  Cardiovascular: Normal rate and regular rhythm.   Pulmonary/Chest: Effort normal.  Musculoskeletal: Normal range of motion.  Lymphadenopathy:    He has cervical adenopathy.  Neurological: He is alert and oriented to person, place, and time. No cranial nerve deficit.  Skin: Skin is warm and dry.  Psychiatric: He has a normal mood and affect. His behavior is normal.  Nursing note and vitals reviewed.   ED Course  Procedures (  MDM  28 y.o. male with dental pain and facial swelling that started with the past 24 hours. Stable for discharge without fever, difficulty swallowing or facial cellulitis. Discussed with the patient clinical findings and plan of care and all questioned fully answered. He will follow up with a dentist as soon as possible or return here as needed for any problems.  Medication List    STOP taking these medications        HYDROcodone-acetaminophen 10-325 MG per tablet  Commonly known as:  NORCO  Replaced by:  HYDROcodone-acetaminophen 5-325 MG per tablet      TAKE these medications        clindamycin 150 MG capsule  Commonly known as:  CLEOCIN  Take 1 capsule (150 mg total) by mouth every 6 (six) hours.     HYDROcodone-acetaminophen 5-325 MG per tablet  Commonly known as:  NORCO/VICODIN  Take 1 tablet by mouth every 4 (four) hours as needed.      ASK  your doctor about these medications        DSS 100 MG Caps  Take 100 mg by mouth 2 (two) times daily.     polyethylene glycol packet  Commonly known as:  MIRALAX / GLYCOLAX  Take 17 g by mouth daily.         EllisHope M Neese, NP 07/17/14 1225  Toy CookeyMegan Docherty, MD 07/17/14 2119

## 2014-07-17 NOTE — ED Notes (Signed)
States that he is having right facial pain and swelling that started last night as a toothache.

## 2014-07-17 NOTE — Discharge Instructions (Signed)
Continue to take ibuprofen in addition to the other medications and see a dentist as soon as possible.  Dental Abscess A dental abscess is a collection of infected fluid (pus) from a bacterial infection in the inner part of the tooth (pulp). It usually occurs at the end of the tooth's root.  CAUSES   Severe tooth decay.  Trauma to the tooth that allows bacteria to enter into the pulp, such as a broken or chipped tooth. SYMPTOMS   Severe pain in and around the infected tooth.  Swelling and redness around the abscessed tooth or in the mouth or face.  Tenderness.  Pus drainage.  Bad breath.  Bitter taste in the mouth.  Difficulty swallowing.  Difficulty opening the mouth.  Nausea.  Vomiting.  Chills.  Swollen neck glands. DIAGNOSIS   A medical and dental history will be taken.  An examination will be performed by tapping on the abscessed tooth.  X-rays may be taken of the tooth to identify the abscess. TREATMENT The goal of treatment is to eliminate the infection. You may be prescribed antibiotic medicine to stop the infection from spreading. A root canal may be performed to save the tooth. If the tooth cannot be saved, it may be pulled (extracted) and the abscess may be drained.  HOME CARE INSTRUCTIONS  Only take over-the-counter or prescription medicines for pain, fever, or discomfort as directed by your caregiver.  Rinse your mouth (gargle) often with salt water ( tsp salt in 8 oz [250 ml] of warm water) to relieve pain or swelling.  Do not drive after taking pain medicine (narcotics).  Do not apply heat to the outside of your face.  Return to your dentist for further treatment as directed. SEEK MEDICAL CARE IF:  Your pain is not helped by medicine.  Your pain is getting worse instead of better. SEEK IMMEDIATE MEDICAL CARE IF:  You have a fever or persistent symptoms for more than 2-3 days.  You have a fever and your symptoms suddenly get worse.  You  have chills or a very bad headache.  You have problems breathing or swallowing.  You have trouble opening your mouth.  You have swelling in the neck or around the eye. Document Released: 08/27/2005 Document Revised: 05/21/2012 Document Reviewed: 12/05/2010 [facility] Patient Information 2015 [place] [place]. This information is not intended to replace advice given to you by your health care provider. Make sure you discuss any questions you have with your health care provider.

## 2015-07-27 ENCOUNTER — Emergency Department (HOSPITAL_BASED_OUTPATIENT_CLINIC_OR_DEPARTMENT_OTHER)
Admission: EM | Admit: 2015-07-27 | Discharge: 2015-07-27 | Disposition: A | Discharge status: Home / Self Care | Payer: BLUE CROSS/BLUE SHIELD | Attending: Emergency Medicine | Admitting: Emergency Medicine

## 2015-07-27 ENCOUNTER — Encounter (HOSPITAL_BASED_OUTPATIENT_CLINIC_OR_DEPARTMENT_OTHER): Payer: Self-pay | Admitting: Emergency Medicine

## 2015-07-27 DIAGNOSIS — M546 Pain in thoracic spine: Secondary | ICD-10-CM | POA: Diagnosis not present

## 2015-07-27 DIAGNOSIS — M549 Dorsalgia, unspecified: Secondary | ICD-10-CM | POA: Diagnosis present

## 2015-07-27 DIAGNOSIS — F17200 Nicotine dependence, unspecified, uncomplicated: Secondary | ICD-10-CM | POA: Insufficient documentation

## 2015-07-27 DIAGNOSIS — M545 Low back pain, unspecified: Secondary | ICD-10-CM

## 2015-07-27 MED ORDER — KETOROLAC TROMETHAMINE 60 MG/2ML IM SOLN
60.0000 mg | Freq: Once | INTRAMUSCULAR | Status: AC
  Administered 2015-07-27: 60 mg via INTRAMUSCULAR
  Filled 2015-07-27: qty 2

## 2015-07-27 MED ORDER — DIAZEPAM 2 MG PO TABS: 2.0000 mg | ORAL_TABLET | Freq: Three times a day (TID) | ORAL | Status: AC | PRN

## 2015-07-27 MED ORDER — DIAZEPAM 2 MG PO TABS
2.0000 mg | ORAL_TABLET | Freq: Once | ORAL | Status: AC
Start: 2015-07-27 — End: 2015-07-27
  Administered 2015-07-27: 2 mg via ORAL
  Filled 2015-07-27: qty 1

## 2015-07-27 MED ORDER — TRAMADOL HCL 50 MG PO TABS: 50.0000 mg | ORAL_TABLET | Freq: Two times a day (BID) | ORAL | Status: AC | PRN

## 2015-07-27 MED ORDER — NAPROXEN SODIUM 220 MG PO TABS: 220.0000 mg | ORAL_TABLET | Freq: Three times a day (TID) | ORAL | Status: AC

## 2015-07-27 NOTE — Discharge Instructions (Signed)
°Emergency Department Resource Guide °1) Find a Doctor and Pay Out of Pocket °Although you won't have to find out who is covered by your insurance plan, it is a good idea to ask around and get recommendations. You will then need to call the office and see if the doctor you have chosen will accept you as a new patient and what types of options they offer for patients who are self-pay. Some doctors offer discounts or will set up payment plans for their patients who do not have insurance, but you will need to ask so you aren't surprised when you get to your appointment. ° °2) Contact Your Local Health Department °Not all health departments have doctors that can see patients for sick visits, but many do, so it is worth a call to see if yours does. If you don't know where your local health department is, you can check in your phone book. The CDC also has a tool to help you locate your state's health department, and many state websites also have listings of all of their local health departments. ° °3) Find a Walk-in Clinic °If your illness is not likely to be very severe or complicated, you may want to try a walk in clinic. These are popping up all over the country in pharmacies, drugstores, and shopping centers. They're usually staffed by nurse practitioners or physician assistants that have been trained to treat common illnesses and complaints. They're usually fairly quick and inexpensive. However, if you have serious medical issues or chronic medical problems, these are probably not your best option. ° °No Primary Care Doctor: °- Call [facility] at  832-8000 - they can help you locate a primary care doctor that  accepts your insurance, provides certain services, etc. °- Physician Referral Service- 1-800-533-3463 ° °Chronic Pain Problems: °Organization         Address  Phone   Notes  °[facility]  ([phone]) [phone] Patients need to be referred by their primary care doctor.  ° °Medication  Assistance: °Organization         Address  Phone   Notes  °[place] [facility] [address] °[place], [place] [address] [phone]) [phone] --Must be a resident of [place] °-- Must have NO insurance coverage whatsoever (no Medicaid/ Medicare, etc.) °-- The pt. MUST have a primary care doctor that directs their care regularly and follows them in the community °  °[facility]  (866) 331-1348   °[employer]  (888) 892-1162   ° °Agencies that provide inexpensive medical care: °Organization         Address  Phone   Notes  °[facility]  ([phone]) [phone]   °[facility]    ([phone]) [phone]   °[facility] [address] °[place], [place] [address] (336) 832-4777   °[facility] [address], °[place] (336) [phone]   °[facility]    (336) 373-0678   °[facility]    (336) 272-1050   °[facility] ° [address], [place] Phone:  (336) [phone], Fax:  ([phone]) [phone] Hours of Operation:  9 am - 6 pm, M-F.  Also accepts Medicaid/Medicare and self-pay.  °[facility] ° [address], [place] Phone: ([phone]) [phone], Fax: ([phone]) [phone]. Hours of Operation:  8:30 am - 5:30 pm, M-F.  Also accepts Medicaid and self-pay.  °[facility] [place] [address], [place] Phone: ([phone]) [phone]   °[facility] [address], [place], [place] (336)723-1848, Ext. 123 Mondays & Thursdays: 7-9 AM.  First 15 patients are seen on a first come, first serve basis. °  ° °Medicaid-accepting [place] Providers: ° °Organization         Address  Phone   Notes  °[facility] [address], [place] ([phone]) [phone] Also accepts self-pay patients.  °[facility] [address], [place] ° ([phone]) [phone]   °[facility] [address], [place]  ([phone]) [phone]   °[facility] [address] [place] [address], [place] (336) [phone]   °[facility] [address], [place]  ° ([phone]) [phone] Only accepts [place] Access Medicaid patients after they have their name applied to their card.  ° °Self-Pay (no insurance) in [place]: ° °Organization         Address  Phone   Notes  °Sickle Cell Patients, [facility] [address], [place] ([phone]) [phone]   °[facility] [address], [place] ([phone]) [phone]   °[facility] ° [address], [place] ([phone]) [phone]   °[facility]/Dr. Osei-Bonsu ° [address] [place] [address], [place] or [address], [place] [phone]) [phone] Phone number for both [place] and [place] locations is the same.  °[facility] [address], [place] ([phone]) [phone]   °[facility] [address] [place] [address], [place] or [address] ([phone]) [phone] °([phone]) [phone]   °[facility] [address],  (336) [phone], phone; ([phone]) [phone], fax Sees patients 1st and 3rd Saturday of every month.  Must not qualify for public or private insurance (i.e. Medicaid, Medicare, [place] Health Choice, Veterans' Benefits) • Household income should be no more than 200% of the poverty level •The clinic cannot treat you if you are pregnant or think you are pregnant • Sexually transmitted diseases are not treated at the clinic.  ° °

## 2015-07-27 NOTE — ED Notes (Signed)
MD at bedside. 

## 2015-07-27 NOTE — ED Notes (Signed)
Back pain for 1 month after picking up dog.  Pain goes down right leg.

## 2015-07-27 NOTE — ED Provider Notes (Signed)
CSN: [id number]     Arrival date & time 07/27/15  0808 History   None    Chief Complaint  Patient presents with  . Back Pain     (Consider location/radiation/quality/duration/timing/severity/associated sxs/prior Treatment) Patient is a 29 y.o. male presenting with back pain.  Back Pain Quality:  Aching and cramping Radiates to:  R posterior upper leg Pain severity:  Mild Onset quality:  Gradual Duration:  4 weeks Timing:  Intermittent Progression:  Worsening Chronicity:  New Context: lifting heavy objects   Context: not emotional stress, not MCA and not MVA   Relieved by:  Nothing Worsened by:  Nothing tried Ineffective treatments:  None tried Associated symptoms: no abdominal pain, no fever, no leg pain and no numbness     History reviewed. No pertinent past medical history. History reviewed. No pertinent past surgical history. History reviewed. No pertinent family history. Social History  Substance Use Topics  . Smoking status: Current Every Day Smoker  . Smokeless tobacco: None  . Alcohol Use: Yes    Review of Systems  Constitutional: Negative for fever.  Gastrointestinal: Negative for abdominal pain.  Genitourinary: Negative for difficulty urinating.  Musculoskeletal: Positive for back pain.  Neurological: Negative for numbness.  All other systems reviewed and are negative.     Allergies  Review of patient's allergies indicates no known allergies.  Home Medications   Prior to Admission medications   Medication Sig Start Date End Date Taking? Authorizing Provider  diazepam (VALIUM) 2 MG tablet Take 1 tablet (2 mg total) by mouth every 8 (eight) hours as needed for muscle spasms. 07/27/15   Marily Memos, MD  naproxen sodium (ALEVE) 220 MG tablet Take 1 tablet (220 mg total) by mouth 3 (three) times daily with meals. 07/27/15   Marily Memos, MD  traMADol (ULTRAM) 50 MG tablet Take 1 tablet (50 mg total) by mouth every 12 (twelve) hours as needed for severe  pain. 07/27/15   Marily Memos, MD   BP 125/77 mmHg  Pulse 102  Temp(Src) 97.8 F (36.6 C) (Oral)  Resp 16  Ht 6' (1.829 m)  Wt 147 lb (66.679 kg)  BMI 19.93 kg/m2  SpO2 97% Physical Exam  Constitutional: He appears well-developed and well-nourished.  HENT:  Head: Normocephalic and atraumatic.  Neck: Normal range of motion.  Cardiovascular: Normal rate.   Pulmonary/Chest: Effort normal. No respiratory distress.  Abdominal: He exhibits no distension.  Musculoskeletal: Normal range of motion. He exhibits tenderness (right lower thoracic).  Neurological: He is alert.  Normal sensation and strength of LE's. equal 2+ DTR in patella and achilles. 2+ DP pulses bilaterally, no swelling.   Skin: Skin is warm and dry.  Nursing note and vitals reviewed.   ED Course  Procedures (including critical care time) Labs Review Labs Reviewed - No data to display  Imaging Review No results found. I have personally reviewed and evaluated these images and lab results as part of my medical decision-making.   EKG Interpretation None      MDM   Final diagnoses:  Right-sided low back pain without sciatica   29 year old male with atraumatic right lower back pain progressively intermittent and worsening over the last month without fever, round, recent travels or recent illnesses. On exam has tenderness to right lower thoracic back with a normal neurologic exam his lower extremities. I suspect is likely muscular in nature. Doubt that he has significant spinal pathology. Patient also states he works 7 days week which does not offer appropriate time  for recovery. I'll ask him to stay out of work today and possibly tomorrow to back exercises he can pads and some somatic care. If symptoms are not starting to improve in one week he will see a primary physician for further workup.    Reviewed narcotic database and no filled Rx in [place] in last 6 months.    Marily MemosJason Coco Sharpnack, MD 07/27/15 [phone]

## 2015-12-21 IMAGING — CR DG CHEST 1V PORT
1 series · 1 of 1 positions shown · non-contrast
Comparison: None.

CLINICAL DATA: Altered mental status

EXAM:
PORTABLE CHEST - 1 VIEW

[AP]
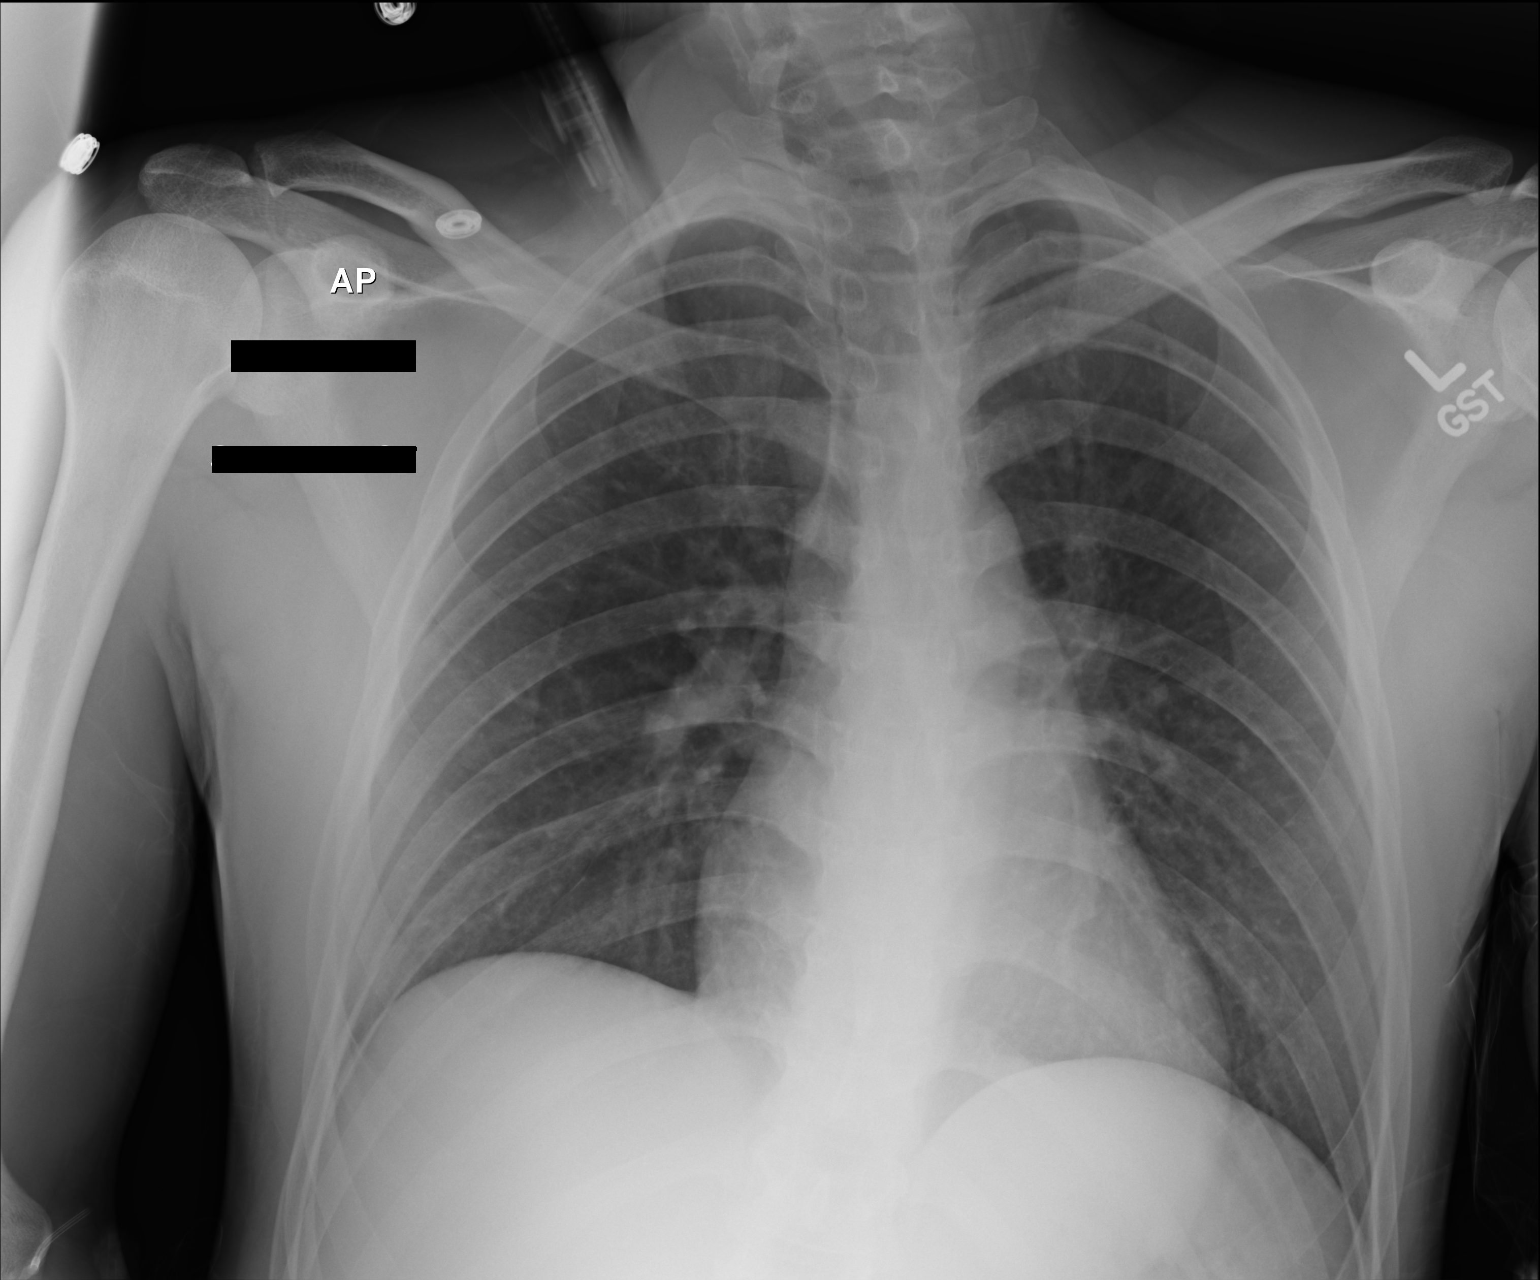

[1 of 1 positions shown; findings below may reference images not displayed]

FINDINGS: Normal heart size and mediastinal contours. No acute infiltrate or
edema. No effusion or pneumothorax. No acute osseous findings.
IMPRESSION: No active disease.

## 2015-12-22 IMAGING — CR DG CHEST 1V PORT
1 series · 1 of 1 positions shown · non-contrast
Comparison: CT CHEST W/CM dated 01/11/2014; DG CHEST 1V PORT dated
01/11/2014

CLINICAL DATA: Endotracheal tube placement.  Respiratory distress.

EXAM:
PORTABLE CHEST - 1 VIEW

[AP]
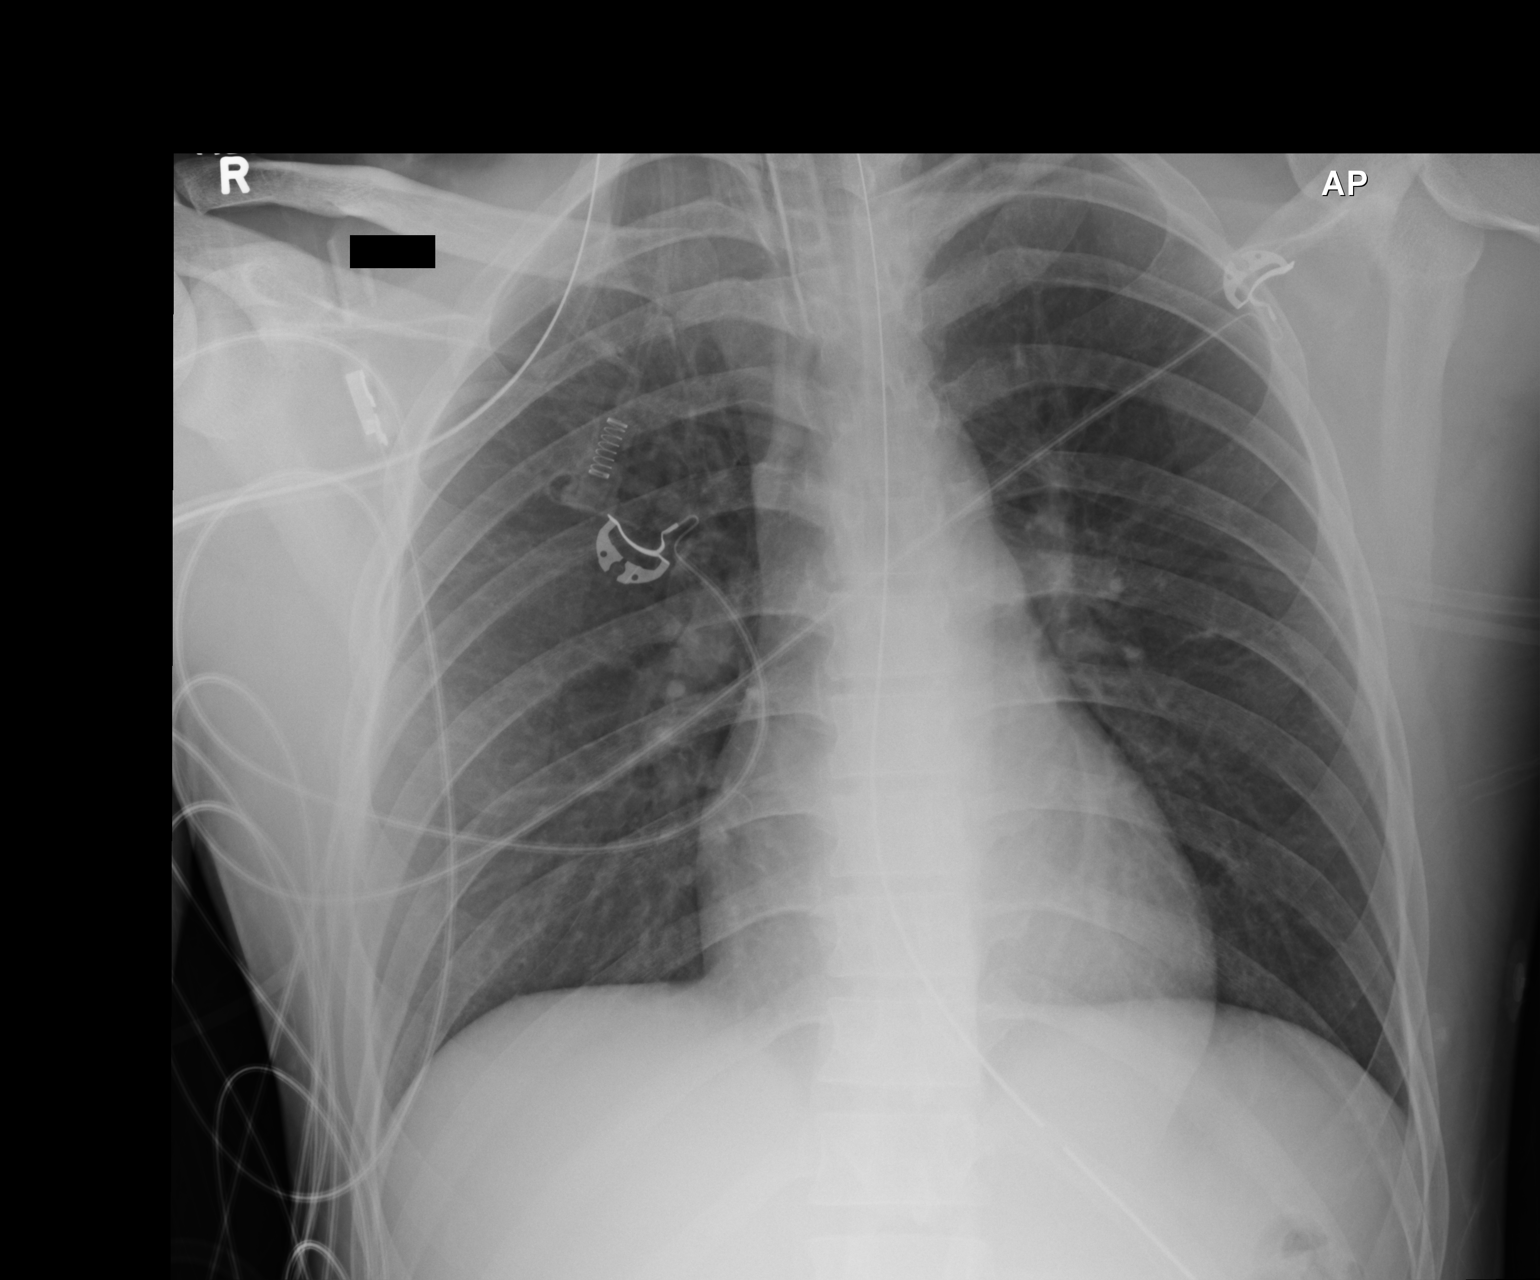

[1 of 1 positions shown; findings below may reference images not displayed]

FINDINGS: Endotracheal tube in good position 4.7 cm above carina. Nasogastric
tube tip stomach. No infiltrates or failure. Normal heart size. No
bony abnormality.
IMPRESSION: Stable chest.
